# Patient Record
Sex: Male | Born: 1970 | Race: White | Hispanic: No | Marital: Single | Smoking: Never smoker
Health system: Southern US, Community
[De-identification: ages and names within clinical notes are randomized; demographics above are authoritative.]

---

## 2005-11-12 ENCOUNTER — Emergency Department (HOSPITAL_COMMUNITY): Admission: EM | Admit: 2005-11-12 | Discharge: 2005-11-12 | Payer: Self-pay | Admitting: Emergency Medicine

## 2009-02-25 ENCOUNTER — Inpatient Hospital Stay (HOSPITAL_COMMUNITY): Admission: EM | Admit: 2009-02-25 | Discharge: 2009-03-01 | Payer: Self-pay | Admitting: Emergency Medicine

## 2009-03-07 ENCOUNTER — Emergency Department (HOSPITAL_COMMUNITY): Admission: EM | Admit: 2009-03-07 | Discharge: 2009-03-07 | Payer: Self-pay | Admitting: Emergency Medicine

## 2009-03-20 ENCOUNTER — Encounter: Admission: RE | Admit: 2009-03-20 | Discharge: 2009-05-25 | Payer: Self-pay | Admitting: Orthopedic Surgery

## 2009-03-22 ENCOUNTER — Emergency Department (HOSPITAL_COMMUNITY): Admission: EM | Admit: 2009-03-22 | Discharge: 2009-03-22 | Payer: Self-pay | Admitting: Emergency Medicine

## 2009-03-26 HISTORY — PX: OTHER SURGICAL HISTORY: SHX169

## 2009-03-27 ENCOUNTER — Ambulatory Visit: Payer: Self-pay | Admitting: Internal Medicine

## 2009-03-27 ENCOUNTER — Encounter: Payer: Self-pay | Admitting: Physician Assistant

## 2009-03-27 ENCOUNTER — Telehealth: Payer: Self-pay | Admitting: Physician Assistant

## 2009-03-27 DIAGNOSIS — J45909 Unspecified asthma, uncomplicated: Secondary | ICD-10-CM | POA: Insufficient documentation

## 2009-03-27 DIAGNOSIS — IMO0002 Reserved for concepts with insufficient information to code with codable children: Secondary | ICD-10-CM | POA: Insufficient documentation

## 2009-03-27 DIAGNOSIS — R930 Abnormal findings on diagnostic imaging of skull and head, not elsewhere classified: Secondary | ICD-10-CM | POA: Insufficient documentation

## 2009-03-27 DIAGNOSIS — S7290XA Unspecified fracture of unspecified femur, initial encounter for closed fracture: Secondary | ICD-10-CM | POA: Insufficient documentation

## 2009-03-29 ENCOUNTER — Encounter: Payer: Self-pay | Admitting: Physician Assistant

## 2009-03-29 LAB — CONVERTED CEMR LAB
ALT: 41 units/L (ref 0–53)
BUN: 16 mg/dL (ref 6–23)
Calcium: 9.5 mg/dL (ref 8.4–10.5)
Chloride: 102 meq/L (ref 96–112)
Glucose, Bld: 105 mg/dL — ABNORMAL HIGH (ref 70–99)
HCT: 44.3 % (ref 39.0–52.0)
Hemoglobin: 14.2 g/dL (ref 13.0–17.0)
Lymphs Abs: 2 10*3/uL (ref 0.7–4.0)
MCV: 89.5 fL (ref 78.0–100.0)
Monocytes Absolute: 0.8 10*3/uL (ref 0.1–1.0)
Monocytes Relative: 9 % (ref 3–12)
Platelets: 396 10*3/uL (ref 150–400)
RBC: 4.95 M/uL (ref 4.22–5.81)
Sodium: 139 meq/L (ref 135–145)
Total Bilirubin: 1.1 mg/dL (ref 0.3–1.2)
WBC: 9.3 10*3/uL (ref 4.0–10.5)

## 2009-04-13 ENCOUNTER — Ambulatory Visit: Payer: Self-pay | Admitting: Physician Assistant

## 2009-04-23 LAB — CONVERTED CEMR LAB
Total CHOL/HDL Ratio: 5.2
Triglycerides: 294 mg/dL — ABNORMAL HIGH (ref <150)

## 2009-11-12 ENCOUNTER — Telehealth: Payer: Self-pay | Admitting: Physician Assistant

## 2010-01-25 ENCOUNTER — Encounter: Payer: Self-pay | Admitting: Physician Assistant

## 2010-04-23 ENCOUNTER — Telehealth: Payer: Self-pay | Admitting: Physician Assistant

## 2010-05-09 ENCOUNTER — Encounter (INDEPENDENT_AMBULATORY_CARE_PROVIDER_SITE_OTHER): Payer: Self-pay | Admitting: Nurse Practitioner

## 2010-05-09 ENCOUNTER — Ambulatory Visit: Payer: Self-pay | Admitting: Nurse Practitioner

## 2010-05-09 LAB — CONVERTED CEMR LAB: Rapid HIV Screen: NEGATIVE

## 2010-05-13 LAB — CONVERTED CEMR LAB
ALT: 38 units/L (ref 0–53)
AST: 33 units/L (ref 0–37)
Albumin: 5.1 g/dL (ref 3.5–5.2)
Basophils Absolute: 0.1 10*3/uL (ref 0.0–0.1)
CO2: 29 meq/L (ref 19–32)
Calcium: 10.2 mg/dL (ref 8.4–10.5)
Chloride: 100 meq/L (ref 96–112)
Cholesterol: 177 mg/dL (ref 0–200)
Eosinophils Absolute: 0.9 10*3/uL — ABNORMAL HIGH (ref 0.0–0.7)
Glucose, Bld: 98 mg/dL (ref 70–99)
HCT: 51.2 % (ref 39.0–52.0)
HDL: 57 mg/dL (ref >39)
Hemoglobin: 17.1 g/dL — ABNORMAL HIGH (ref 13.0–17.0)
Lymphocytes Relative: 33 % (ref 12–46)
Lymphs Abs: 3.7 10*3/uL (ref 0.7–4.0)
MCHC: 33.4 g/dL (ref 30.0–36.0)
MCV: 89.7 fL (ref 78.0–100.0)
Monocytes Absolute: 1 10*3/uL (ref 0.1–1.0)
Monocytes Relative: 9 % (ref 3–12)
Neutrophils Relative %: 50 % (ref 43–77)
RDW: 14 % (ref 11.5–15.5)
Total Bilirubin: 1 mg/dL (ref 0.3–1.2)
Total CHOL/HDL Ratio: 3.1
WBC: 11.3 10*3/uL — ABNORMAL HIGH (ref 4.0–10.5)

## 2010-06-25 NOTE — Progress Notes (Signed)
Summary: can't get his inhaler  Phone Note Call from Patient Call back at Home Phone [phone]   Reason for Call: Refill Medication Summary of Call: Hinata Diener pt. mr Dominique needs another refill on his albuterol inhaler and [facility] but was told that they no lonler have it and he would have to call the company for more, but he doesn't understand that. Initial call taken by: Leodis Rains,  April 23, 2010 9:33 AM  Follow-up for Phone Call        Spoke with Crystal at MAP with [facility] --  Inhaler was discontinued from regular formulary.  Pt. is not enrolled in MAP, needs financials and appt. to be enrolled.  Pt. advised of this and states will go to [facility] to get enrolled; says he still has enough albuterol to last until his next appointment here on 05/09/10. Follow-up by: Dutch Quint RN,  April 24, 2010 11:39 AM

## 2010-06-25 NOTE — Letter (Signed)
Summary: MAILED REQUESTED RECORDS TO [facility]  MAILED REQUESTED RECORDS TO [facility]   Imported By: Arta Bruce 01/25/2010 11:49:23  _____________________________________________________________________  External Attachment:    Type:   Image     Comment:   External Document
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
## 2010-06-27 NOTE — Assessment & Plan Note (Signed)
Summary: Asthma   Vital Signs:  Patient profile:   40 year old male Height:      66.50 inches Weight:      145.6 pounds BMI:     23.23 O2 Sat:      90 % on Room air Temp:     97.0 degrees F oral Pulse rate:   100 / minute Pulse rhythm:   regular Resp:     20 per minute BP sitting:   102 / 80  (left arm) Cuff size:   large  Vitals Entered By: Levon Hedger (May 09, 2010 12:11 PM)  O2 Flow:  Room air  Serial Vital Signs/Assessments:  Comments: P/F  270,  220,  230 By: Levon Hedger   CC: follow-up visit...needs breathing medication refilled Is Patient Diabetic? No Pain Assessment Patient in pain? no       Does patient need assistance? Functional Status Self care Ambulation Normal   CC:  follow-up visit...needs breathing medication refilled.  History of Present Illness:  Pt into the office for for f/u on asthma. "The past 2 days have been bad days"  Chest CT done 02/2009 shows a pulmonary nodule and was recommended to have the CT repeated in 12 months.  Pt was notified at that time of the results.   Pt is fasting today for labs  Asthma History    Initial Asthma Severity Rating:    Age range: 12+ years    Symptoms: 0-2 days/week    Nighttime Awakenings: 0-2/month    Interferes w/ normal activity: no limitations    SABA use (not for EIB): 0-2 days/week    Exacerbations requiring oral systemic steroids: 0-1/year    Asthma Severity Assessment: Intermittent    Habits & Providers  Alcohol-Tobacco-Diet     Alcohol drinks/day: <1     Alcohol Counseling: not indicated; use of alcohol is not excessive or problematic     Tobacco Status: never  Exercise-Depression-Behavior     Drug Use: marijuanna - socially  Allergies (verified): No Known Drug Allergies  Social History: Drug Use:  marijuanna - socially  Review of Systems General:  Denies fever. CV:  Denies chest pain or discomfort. Resp:  Denies shortness of breath. GI:  Denies abdominal  pain, nausea, and vomiting.  Physical Exam  General:  alert.  long hair Head:  normocephalic.   Mouth:  pharynx pink and moist.   Lungs:  scattered wheezes Heart:  normal rate and regular rhythm.   Neurologic:  cane use   Impression & Recommendations:  Problem # 1:  ASTHMA (ICD-493.90) advair diskus given to pt will refill inhaler asthma action plan given to pt His updated medication list for this problem includes:    Proventil Hfa 108 (90 Base) Mcg/act Aers (Albuterol sulfate) .Marland Kitchen... 1-2 puffs q 4-6 hours as needed for shortness of breath    Advair Diskus 250-50 Mcg/dose Aepb (Fluticasone-salmeterol) ..... One inhalation two times a day  Orders: Peak Flow Rate (94150) Pulse Oximetry (single measurment) (16109) T-Lipid Profile (60454-09811) T-Comprehensive Metabolic Panel (91478-29562) T-CBC w/Diff (13086-57846) Rapid HIV  (96295) T-TSH (28413-24401)  Problem # 2:  CT, CHEST, ABNORMAL (ICD-793.1) pt has declined to schedule at this time.  recommendation given to pt  Complete Medication List: 1)  Proventil Hfa 108 (90 Base) Mcg/act Aers (Albuterol sulfate) .Marland Kitchen.. 1-2 puffs q 4-6 hours as needed for shortness of breath 2)  Advair Diskus 250-50 Mcg/dose Aepb (Fluticasone-salmeterol) .... One inhalation two times a day  Other Orders: Flu Vaccine 66yrs + (  91478) Admin 1st Vaccine (29562)  Asthma Management Plan    Asthma Severity: Intermittent    Personal best PEF: 270 liters/minute    Predicted PEF: 626 liters/minute    Working PEF: 626 liters/minute    Plan based on PEF formula: [place] [employer] Zone: (Range: 500 to 630) ADVAIR DISKUS 250-50 MCG/DOSE AEPB:  1 inhalation twice a day  Yellow Zone: PROVENTIL HFA 108 (90 BASE) MCG/ACT AERS:  2 puffs every 4 hours as needed  Red Zone: Call your physician for shortness of breath.    Patient Instructions: 1)  You have been given the flu vaccine today 2)  Asthma - you have some wheezes today - likely from past 2 days  of asthma.  use advair - 1 inhalation two times a day (rinse mouth after use) 3)  Use inhaler as needed for shortness 4)  The recommendation is for your to get a repeat CT in 12 months which would be now.  You have declined at this time.  If you change your mind then inform this office Prescriptions: ADVAIR DISKUS 250-50 MCG/DOSE AEPB (FLUTICASONE-SALMETEROL) One inhalation two times a day  #1 x 0   Entered and Authorized by:   Lehman Prom FNP   Signed by:   Lehman Prom FNP on 05/09/2010   Method used:   Print then Give to Patient   RxID:   [id number] PROVENTIL HFA 108 (90 BASE) MCG/ACT AERS (ALBUTEROL SULFATE) 1-2 puffs q 4-6 hours as needed for shortness of breath  #1 x 3   Entered and Authorized by:   Lehman Prom FNP   Signed by:   Lehman Prom FNP on 05/09/2010   Method used:   Print then Give to Patient   RxID:   [id number]    Orders Added: 1)  Peak Flow Rate [94150] 2)  Pulse Oximetry (single measurment) [94760] 3)  Est. Patient Level III [64403] 4)  T-Lipid Profile [80061-22930] 5)  T-Comprehensive Metabolic Panel [80053-22900] 6)  T-CBC w/Diff [47425-95638] 7)  Rapid HIV  [92370] 8)  T-TSH [75643-32951] 9)  Flu Vaccine 75yrs + [88416] 10)  Admin 1st Vaccine [60630]   Immunizations Administered:  Influenza Vaccine # 1:    Vaccine Type: Fluvax 3+    Site: left deltoid    Mfr: GlaxoSmithKline    Dose: 0.5 ml    Route: IM    Given by: Hale Drone CMA    Exp. Date: 11/23/2010    Lot #: [id number]    VIS given: 12/18/09 version given May 09, 2010.  Flu Vaccine Consent Questions:    Do you have a history of severe allergic reactions to this vaccine? no    Any prior history of allergic reactions to egg and/or gelatin? no    Do you have a sensitivity to the preservative Thimersol? no    Do you have a past history of Guillan-Barre Syndrome? no    Do you currently have an acute febrile illness? no    Have you ever had a severe reaction to  latex? no    Vaccine information given and explained to patient? yes   Immunizations Administered:  Influenza Vaccine # 1:    Vaccine Type: Fluvax 3+    Site: left deltoid    Mfr: GlaxoSmithKline    Dose: 0.5 ml    Route: IM    Given by: Hale Drone CMA    Exp. Date: 11/23/2010    Lot #: [id number]    VIS given: 12/18/09 version  given May 09, 2010.  Prevention & Chronic Care Immunizations   Influenza vaccine: Fluvax 3+  (05/09/2010)    Tetanus booster: Not documented    Pneumococcal vaccine: Not documented  Other Screening   Smoking status: never  (05/09/2010)  Lipids   Total Cholesterol: 214  (04/13/2009)   LDL: 114  (04/13/2009)   LDL Direct: Not documented   HDL: 41  (04/13/2009)   Triglycerides: 294  (04/13/2009)   Nursing Instructions: Give Flu vaccine today     Laboratory Results  Date/Time Received: May 09, 2010 2:04 PM   Other Tests  Rapid HIV: negative

## 2010-07-30 ENCOUNTER — Encounter (INDEPENDENT_AMBULATORY_CARE_PROVIDER_SITE_OTHER): Payer: Self-pay | Admitting: Nurse Practitioner

## 2010-08-06 NOTE — Letter (Signed)
Summary: MAILED REQUESTED RECORDS TO [facility]   MAILED REQUESTED RECORDS TO [facility]   Imported By: Arta Bruce 07/30/2010 14:23:36  _____________________________________________________________________  External Attachment:    Type:   Image     Comment:   External Document

## 2010-08-29 LAB — CBC
HCT: 31.2 % — ABNORMAL LOW (ref 39.0–52.0)
HCT: 31.2 % — ABNORMAL LOW (ref 39.0–52.0)
Hemoglobin: 10.7 g/dL — ABNORMAL LOW (ref 13.0–17.0)
Hemoglobin: 10.8 g/dL — ABNORMAL LOW (ref 13.0–17.0)
Hemoglobin: 11.4 g/dL — ABNORMAL LOW (ref 13.0–17.0)
MCHC: 33.4 g/dL (ref 30.0–36.0)
MCHC: 34.3 g/dL (ref 30.0–36.0)
Platelets: 232 10*3/uL (ref 150–400)
Platelets: 266 10*3/uL (ref 150–400)
RBC: 3.52 MIL/uL — ABNORMAL LOW (ref 4.22–5.81)
RBC: 3.77 MIL/uL — ABNORMAL LOW (ref 4.22–5.81)
RDW: 14 % (ref 11.5–15.5)
RDW: 14.6 % (ref 11.5–15.5)
RDW: 15.8 % — ABNORMAL HIGH (ref 11.5–15.5)
WBC: 10.1 10*3/uL (ref 4.0–10.5)
WBC: 21.8 10*3/uL — ABNORMAL HIGH (ref 4.0–10.5)

## 2010-08-29 LAB — TYPE AND SCREEN

## 2010-08-29 LAB — POCT I-STAT 7, (LYTES, BLD GAS, ICA,H+H)
Acid-Base Excess: 1 mmol/L (ref 0.0–2.0)
Acid-Base Excess: 3 mmol/L — ABNORMAL HIGH (ref 0.0–2.0)
Bicarbonate: 25 mEq/L — ABNORMAL HIGH (ref 20.0–24.0)
Bicarbonate: 26.3 mEq/L — ABNORMAL HIGH (ref 20.0–24.0)
Bicarbonate: 26.3 mEq/L — ABNORMAL HIGH (ref 20.0–24.0)
Bicarbonate: 27.3 mEq/L — ABNORMAL HIGH (ref 20.0–24.0)
Calcium, Ion: 1.01 mmol/L — ABNORMAL LOW (ref 1.12–1.32)
HCT: 21 % — ABNORMAL LOW (ref 39.0–52.0)
HCT: 29 % — ABNORMAL LOW (ref 39.0–52.0)
HCT: 30 % — ABNORMAL LOW (ref 39.0–52.0)
Hemoglobin: 10.2 g/dL — ABNORMAL LOW (ref 13.0–17.0)
Hemoglobin: 7.1 g/dL — CL (ref 13.0–17.0)
O2 Saturation: 100 %
O2 Saturation: 100 %
Patient temperature: 36.1
Patient temperature: 36.7
Sodium: 135 mEq/L (ref 135–145)
Sodium: 135 mEq/L (ref 135–145)
TCO2: 26 mmol/L (ref 0–100)
TCO2: 28 mmol/L (ref 0–100)
pCO2 arterial: 38 mmHg (ref 35.0–45.0)
pH, Arterial: 7.408 (ref 7.350–7.450)
pH, Arterial: 7.445 (ref 7.350–7.450)
pO2, Arterial: 469 mmHg — ABNORMAL HIGH (ref 80.0–100.0)
pO2, Arterial: 508 mmHg — ABNORMAL HIGH (ref 80.0–100.0)

## 2010-08-29 LAB — BASIC METABOLIC PANEL
CO2: 30 mEq/L (ref 19–32)
Calcium: 7 mg/dL — ABNORMAL LOW (ref 8.4–10.5)
Calcium: 7.4 mg/dL — ABNORMAL LOW (ref 8.4–10.5)
Creatinine, Ser: 0.71 mg/dL (ref 0.4–1.5)
GFR calc Af Amer: >60 mL/min (ref >60)
GFR calc Af Amer: >60 mL/min (ref >60)
GFR calc non Af Amer: >60 mL/min (ref >60)
GFR calc non Af Amer: >60 mL/min (ref >60)
GFR calc non Af Amer: >60 mL/min (ref >60)
Glucose, Bld: 129 mg/dL — ABNORMAL HIGH (ref 70–99)
Potassium: 3.1 mEq/L — ABNORMAL LOW (ref 3.5–5.1)
Potassium: 4.1 mEq/L (ref 3.5–5.1)
Sodium: 131 mEq/L — ABNORMAL LOW (ref 135–145)
Sodium: 132 mEq/L — ABNORMAL LOW (ref 135–145)
Sodium: 133 mEq/L — ABNORMAL LOW (ref 135–145)

## 2010-08-29 LAB — COMPREHENSIVE METABOLIC PANEL
AST: 117 U/L — ABNORMAL HIGH (ref 0–37)
Albumin: 3.1 g/dL — ABNORMAL LOW (ref 3.5–5.2)
Alkaline Phosphatase: 82 U/L (ref 39–117)
BUN: 8 mg/dL (ref 6–23)
CO2: 28 mEq/L (ref 19–32)
Chloride: 103 mEq/L (ref 96–112)
GFR calc Af Amer: >60 mL/min (ref >60)
GFR calc non Af Amer: >60 mL/min (ref >60)
Glucose, Bld: 182 mg/dL — ABNORMAL HIGH (ref 70–99)
Potassium: 3 mEq/L — ABNORMAL LOW (ref 3.5–5.1)
Total Bilirubin: 0.6 mg/dL (ref 0.3–1.2)

## 2010-08-29 LAB — URINALYSIS, ROUTINE W REFLEX MICROSCOPIC
Bilirubin Urine: NEGATIVE
Specific Gravity, Urine: 1.024 (ref 1.005–1.030)
Urobilinogen, UA: 0.2 mg/dL (ref 0.0–1.0)

## 2010-08-29 LAB — RAPID URINE DRUG SCREEN, HOSP PERFORMED
Benzodiazepines: POSITIVE — AB
Cocaine: NOT DETECTED
Tetrahydrocannabinol: NOT DETECTED

## 2010-08-29 LAB — DIFFERENTIAL
Basophils Relative: 0 % (ref 0–1)
Eosinophils Absolute: 1.1 10*3/uL — ABNORMAL HIGH (ref 0.0–0.7)
Eosinophils Relative: 1 % (ref 0–5)
Lymphocytes Relative: 9 % — ABNORMAL LOW (ref 12–46)
Monocytes Absolute: 0.7 10*3/uL (ref 0.1–1.0)
Monocytes Relative: 5 % (ref 3–12)
Monocytes Relative: 6 % (ref 3–12)
Neutro Abs: 8.9 10*3/uL — ABNORMAL HIGH (ref 1.7–7.7)
Neutrophils Relative %: 67 % (ref 43–77)

## 2010-08-29 LAB — ETHANOL: Alcohol, Ethyl (B): <5 mg/dL (ref 0–10)

## 2010-08-29 LAB — URINE MICROSCOPIC-ADD ON

## 2010-08-29 LAB — URINE CULTURE

## 2010-08-29 LAB — ABO/RH: ABO/RH(D): A POS

## 2010-08-29 LAB — MRSA PCR SCREENING

## 2010-08-29 LAB — PROTIME-INR: INR: 1 (ref 0.00–1.49)
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
## 2011-08-26 ENCOUNTER — Encounter (HOSPITAL_COMMUNITY): Payer: Self-pay | Admitting: Pharmacy Technician

## 2011-09-04 ENCOUNTER — Other Ambulatory Visit: Payer: Self-pay

## 2011-09-04 ENCOUNTER — Encounter (HOSPITAL_COMMUNITY)
Admission: RE | Admit: 2011-09-04 | Discharge: 2011-09-04 | Disposition: A | Discharge status: Home / Self Care | Payer: Medicaid Other | Source: Ambulatory Visit | Attending: Orthopedic Surgery | Admitting: Orthopedic Surgery

## 2011-09-04 ENCOUNTER — Encounter (HOSPITAL_COMMUNITY): Payer: Self-pay

## 2011-09-04 ENCOUNTER — Ambulatory Visit (HOSPITAL_COMMUNITY)
Admission: RE | Admit: 2011-09-04 | Discharge: 2011-09-04 | Disposition: A | Discharge status: Home / Self Care | Payer: Medicaid Other | Source: Ambulatory Visit | Attending: Orthopedic Surgery | Admitting: Orthopedic Surgery

## 2011-09-04 DIAGNOSIS — Z01818 Encounter for other preprocedural examination: Secondary | ICD-10-CM | POA: Insufficient documentation

## 2011-09-04 DIAGNOSIS — Z01812 Encounter for preprocedural laboratory examination: Secondary | ICD-10-CM | POA: Insufficient documentation

## 2011-09-04 LAB — CBC
HCT: 47.8 % (ref 39.0–52.0)
Platelets: 391 10*3/uL (ref 150–400)
RBC: 5.37 MIL/uL (ref 4.22–5.81)
RDW: 13.8 % (ref 11.5–15.5)
WBC: 9.4 10*3/uL (ref 4.0–10.5)

## 2011-09-04 LAB — URINALYSIS, ROUTINE W REFLEX MICROSCOPIC
Leukocytes, UA: NEGATIVE
Nitrite: NEGATIVE
Protein, ur: NEGATIVE mg/dL
Urobilinogen, UA: 0.2 mg/dL (ref 0.0–1.0)

## 2011-09-04 LAB — APTT: aPTT: 31 seconds (ref 24–37)

## 2011-09-04 LAB — DIFFERENTIAL
Basophils Absolute: 0 10*3/uL (ref 0.0–0.1)
Lymphocytes Relative: 28 % (ref 12–46)
Neutro Abs: 6 10*3/uL (ref 1.7–7.7)

## 2011-09-04 LAB — BASIC METABOLIC PANEL
Calcium: 10 mg/dL (ref 8.4–10.5)
Chloride: 101 mEq/L (ref 96–112)
Creatinine, Ser: 0.77 mg/dL (ref 0.50–1.35)
GFR calc Af Amer: >90 mL/min (ref >90)
Sodium: 140 mEq/L (ref 135–145)

## 2011-09-04 LAB — SURGICAL PCR SCREEN: Staphylococcus aureus: POSITIVE — AB

## 2011-09-04 LAB — PROTIME-INR: Prothrombin Time: 12.8 seconds (ref 11.6–15.2)

## 2011-09-04 NOTE — Patient Instructions (Addendum)
20 Monterrio Gerst  09/04/2011   Your procedure is scheduled on:  09-08-11   Report to [facility] at 1030  AM.  Call this number if you have problems the morning of surgery: [phone]   Remember:   Do not eat food or drink liquids:After Midnight.  .  Take these medicines the morning of surgery with A SIP OF WATER: advair, hydrocodone if needed   Do not wear jewelry or make up.  Do not wear lotions, powders, or perfumes.Do not wear deodorant.    Do not bring valuables to the hospital.  Contacts, dentures or bridgework may not be worn into surgery.  Leave suitcase in the car. After surgery it may be brought to your room.  For patients admitted to the hospital, checkout time is 11:00 AM the day of discharge.     Special Instructions: CHG Shower Use Special Wash: 1/2 bottle night before surgery and 1/2 bottle morning of surgery.neck down avoid private area   Please read over the following fact sheets that you were given: MRSA Information  [facility] pre op nurse phone number [phone], call if needed
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
## 2011-09-05 NOTE — Pre-Procedure Instructions (Signed)
Pt aware surgery time changed to 0730, arrive 0530 am 09-08-11

## 2011-09-08 ENCOUNTER — Ambulatory Visit (HOSPITAL_COMMUNITY): Payer: Medicaid Other

## 2011-09-08 ENCOUNTER — Encounter (HOSPITAL_COMMUNITY): Payer: Self-pay | Admitting: Anesthesiology

## 2011-09-08 ENCOUNTER — Encounter (HOSPITAL_COMMUNITY)
Admission: RE | Disposition: A | Discharge status: Home Health | Payer: Self-pay | Source: Ambulatory Visit | Attending: Orthopedic Surgery

## 2011-09-08 ENCOUNTER — Inpatient Hospital Stay (HOSPITAL_COMMUNITY)
Admission: RE | Admit: 2011-09-08 | Discharge: 2011-09-10 | DRG: 467 | Disposition: A | Discharge status: Home Health | Payer: Medicaid Other | Source: Ambulatory Visit | Attending: Orthopedic Surgery | Admitting: Orthopedic Surgery

## 2011-09-08 ENCOUNTER — Encounter (HOSPITAL_COMMUNITY): Payer: Self-pay | Admitting: *Deleted

## 2011-09-08 ENCOUNTER — Ambulatory Visit (HOSPITAL_COMMUNITY): Payer: Medicaid Other | Admitting: Anesthesiology

## 2011-09-08 DIAGNOSIS — T84498A Other mechanical complication of other internal orthopedic devices, implants and grafts, initial encounter: Principal | ICD-10-CM | POA: Diagnosis present

## 2011-09-08 DIAGNOSIS — Z96649 Presence of unspecified artificial hip joint: Secondary | ICD-10-CM

## 2011-09-08 DIAGNOSIS — M87059 Idiopathic aseptic necrosis of unspecified femur: Secondary | ICD-10-CM | POA: Diagnosis present

## 2011-09-08 DIAGNOSIS — Y831 Surgical operation with implant of artificial internal device as the cause of abnormal reaction of the patient, or of later complication, without mention of misadventure at the time of the procedure: Secondary | ICD-10-CM | POA: Diagnosis present

## 2011-09-08 DIAGNOSIS — J45909 Unspecified asthma, uncomplicated: Secondary | ICD-10-CM | POA: Diagnosis present

## 2011-09-08 HISTORY — PX: HARDWARE REMOVAL: SHX979

## 2011-09-08 LAB — TYPE AND SCREEN: ABO/RH(D): A POS

## 2011-09-08 LAB — ABO/RH: ABO/RH(D): A POS

## 2011-09-08 SURGERY — REMOVAL, HARDWARE
Anesthesia: General | Site: Hip | Laterality: Left | Wound class: Clean

## 2011-09-08 MED ORDER — DIPHENHYDRAMINE HCL 25 MG PO CAPS: 25.0000 mg | ORAL_CAPSULE | Freq: Four times a day (QID) | ORAL | Status: DC | PRN

## 2011-09-08 MED ORDER — HETASTARCH-ELECTROLYTES 6 % IV SOLN
INTRAVENOUS | Status: DC | PRN
  Administered 2011-09-08: 09:00:00 via INTRAVENOUS

## 2011-09-08 MED ORDER — HYDROMORPHONE HCL PF 1 MG/ML IJ SOLN
0.2500 mg | INTRAMUSCULAR | Status: DC | PRN
  Administered 2011-09-08 (×4): 0.5 mg via INTRAVENOUS

## 2011-09-08 MED ORDER — FLEET ENEMA 7-19 GM/118ML RE ENEM: 1.0000 | ENEMA | Freq: Once | RECTAL | Status: AC | PRN

## 2011-09-08 MED ORDER — HYDROMORPHONE HCL PF 1 MG/ML IJ SOLN
INTRAMUSCULAR | Status: AC
  Filled 2011-09-08: qty 1

## 2011-09-08 MED ORDER — SODIUM CHLORIDE 0.9 % IV SOLN
100.0000 mL/h | INTRAVENOUS | Status: DC
  Administered 2011-09-08 (×2): 100 mL/h via INTRAVENOUS
  Filled 2011-09-08 (×8): qty 1000

## 2011-09-08 MED ORDER — ALUM & MAG HYDROXIDE-SIMETH 200-200-20 MG/5ML PO SUSP: 30.0000 mL | ORAL | Status: DC | PRN

## 2011-09-08 MED ORDER — ROCURONIUM BROMIDE 100 MG/10ML IV SOLN
INTRAVENOUS | Status: DC | PRN
  Administered 2011-09-08: 20 mg via INTRAVENOUS

## 2011-09-08 MED ORDER — POLYETHYLENE GLYCOL 3350 17 G PO PACK
17.0000 g | PACK | Freq: Two times a day (BID) | ORAL | Status: DC
  Administered 2011-09-08 – 2011-09-09 (×2): 17 g via ORAL
  Filled 2011-09-08 (×5): qty 1

## 2011-09-08 MED ORDER — BISACODYL 5 MG PO TBEC: 5.0000 mg | DELAYED_RELEASE_TABLET | Freq: Every day | ORAL | Status: DC | PRN

## 2011-09-08 MED ORDER — CIPROFLOXACIN IN D5W 400 MG/200ML IV SOLN
INTRAVENOUS | Status: AC
  Filled 2011-09-08: qty 200

## 2011-09-08 MED ORDER — METOCLOPRAMIDE HCL 5 MG/ML IJ SOLN: 5.0000 mg | Freq: Three times a day (TID) | INTRAMUSCULAR | Status: DC | PRN

## 2011-09-08 MED ORDER — DEXAMETHASONE SODIUM PHOSPHATE 10 MG/ML IJ SOLN
INTRAMUSCULAR | Status: DC | PRN
  Administered 2011-09-08: 10 mg via INTRAVENOUS

## 2011-09-08 MED ORDER — DIAZEPAM 5 MG PO TABS: 10.0000 mg | ORAL_TABLET | Freq: Three times a day (TID) | ORAL | Status: DC | PRN

## 2011-09-08 MED ORDER — ONDANSETRON HCL 4 MG/2ML IJ SOLN
INTRAMUSCULAR | Status: DC | PRN
  Administered 2011-09-08: 4 mg via INTRAVENOUS

## 2011-09-08 MED ORDER — PROPOFOL 10 MG/ML IV BOLUS
INTRAVENOUS | Status: DC | PRN
  Administered 2011-09-08: 150 mg via INTRAVENOUS

## 2011-09-08 MED ORDER — ONDANSETRON HCL 4 MG/2ML IJ SOLN: 4.0000 mg | Freq: Four times a day (QID) | INTRAMUSCULAR | Status: DC | PRN

## 2011-09-08 MED ORDER — LACTATED RINGERS IV SOLN
INTRAVENOUS | Status: DC | PRN
  Administered 2011-09-08 (×3): via INTRAVENOUS

## 2011-09-08 MED ORDER — ACETAMINOPHEN 10 MG/ML IV SOLN
INTRAVENOUS | Status: AC
  Filled 2011-09-08: qty 100

## 2011-09-08 MED ORDER — TRANEXAMIC ACID 100 MG/ML IV SOLN
980.0000 mg | Freq: Once | INTRAVENOUS | Status: AC
  Administered 2011-09-08: 980 mg via INTRAVENOUS
  Filled 2011-09-08: qty 9.8

## 2011-09-08 MED ORDER — HYDROMORPHONE HCL PF 1 MG/ML IJ SOLN: 0.5000 mg | INTRAMUSCULAR | Status: DC | PRN

## 2011-09-08 MED ORDER — SUCCINYLCHOLINE CHLORIDE 20 MG/ML IJ SOLN
INTRAMUSCULAR | Status: DC | PRN
  Administered 2011-09-08: 100 mg via INTRAVENOUS

## 2011-09-08 MED ORDER — RIVAROXABAN 10 MG PO TABS
10.0000 mg | ORAL_TABLET | ORAL | Status: DC
  Administered 2011-09-09 – 2011-09-10 (×2): 10 mg via ORAL
  Filled 2011-09-08 (×2): qty 1

## 2011-09-08 MED ORDER — KETOROLAC TROMETHAMINE 15 MG/ML IJ SOLN
15.0000 mg | Freq: Four times a day (QID) | INTRAMUSCULAR | Status: DC
  Administered 2011-09-08 – 2011-09-09 (×5): 15 mg via INTRAVENOUS
  Filled 2011-09-08 (×10): qty 1

## 2011-09-08 MED ORDER — CEFAZOLIN SODIUM 1-5 GM-% IV SOLN
1.0000 g | INTRAVENOUS | Status: AC
  Administered 2011-09-08: 1 g via INTRAVENOUS

## 2011-09-08 MED ORDER — DOCUSATE SODIUM 100 MG PO CAPS
100.0000 mg | ORAL_CAPSULE | Freq: Two times a day (BID) | ORAL | Status: DC
  Administered 2011-09-08 – 2011-09-09 (×3): 100 mg via ORAL
  Filled 2011-09-08 (×5): qty 1

## 2011-09-08 MED ORDER — FERROUS SULFATE 325 (65 FE) MG PO TABS
325.0000 mg | ORAL_TABLET | Freq: Three times a day (TID) | ORAL | Status: DC
  Administered 2011-09-08 – 2011-09-09 (×3): 325 mg via ORAL
  Filled 2011-09-08 (×7): qty 1

## 2011-09-08 MED ORDER — ZOLPIDEM TARTRATE 5 MG PO TABS: 5.0000 mg | ORAL_TABLET | Freq: Every evening | ORAL | Status: DC | PRN

## 2011-09-08 MED ORDER — PROMETHAZINE HCL 25 MG/ML IJ SOLN: 6.2500 mg | INTRAMUSCULAR | Status: DC | PRN

## 2011-09-08 MED ORDER — FENTANYL CITRATE 0.05 MG/ML IJ SOLN
INTRAMUSCULAR | Status: DC | PRN
  Administered 2011-09-08 (×6): 50 ug via INTRAVENOUS
  Administered 2011-09-08: 100 ug via INTRAVENOUS
  Administered 2011-09-08 (×2): 50 ug via INTRAVENOUS

## 2011-09-08 MED ORDER — ACETAMINOPHEN 10 MG/ML IV SOLN
INTRAVENOUS | Status: DC | PRN
  Administered 2011-09-08: 1000 mg via INTRAVENOUS

## 2011-09-08 MED ORDER — MEPERIDINE HCL 50 MG/ML IJ SOLN: 6.2500 mg | INTRAMUSCULAR | Status: DC | PRN

## 2011-09-08 MED ORDER — METOCLOPRAMIDE HCL 10 MG PO TABS: 5.0000 mg | ORAL_TABLET | Freq: Three times a day (TID) | ORAL | Status: DC | PRN

## 2011-09-08 MED ORDER — HYDROCODONE-ACETAMINOPHEN 7.5-325 MG PO TABS
1.0000 | ORAL_TABLET | ORAL | Status: DC
  Administered 2011-09-08 – 2011-09-10 (×11): 2 via ORAL
  Filled 2011-09-08 (×11): qty 2

## 2011-09-08 MED ORDER — MENTHOL 3 MG MT LOZG: 1.0000 | LOZENGE | OROMUCOSAL | Status: DC | PRN

## 2011-09-08 MED ORDER — DEXAMETHASONE SODIUM PHOSPHATE 10 MG/ML IJ SOLN
10.0000 mg | Freq: Once | INTRAMUSCULAR | Status: AC
  Administered 2011-09-09: 10 mg via INTRAVENOUS
  Filled 2011-09-08: qty 1

## 2011-09-08 MED ORDER — METHOCARBAMOL 100 MG/ML IJ SOLN
500.0000 mg | Freq: Four times a day (QID) | INTRAVENOUS | Status: DC | PRN
  Administered 2011-09-08 (×2): 500 mg via INTRAVENOUS
  Filled 2011-09-08 (×2): qty 5

## 2011-09-08 MED ORDER — CIPROFLOXACIN IN D5W 400 MG/200ML IV SOLN
INTRAVENOUS | Status: DC | PRN
  Administered 2011-09-08: 400 mg via INTRAVENOUS

## 2011-09-08 MED ORDER — MIDAZOLAM HCL 5 MG/5ML IJ SOLN
INTRAMUSCULAR | Status: DC | PRN
  Administered 2011-09-08: 2 mg via INTRAVENOUS

## 2011-09-08 MED ORDER — METHOCARBAMOL 500 MG PO TABS
500.0000 mg | ORAL_TABLET | Freq: Four times a day (QID) | ORAL | Status: DC | PRN
  Administered 2011-09-09: 500 mg via ORAL
  Filled 2011-09-08: qty 1

## 2011-09-08 MED ORDER — LACTATED RINGERS IV SOLN
INTRAVENOUS | Status: DC
  Administered 2011-09-08: 12:00:00 via INTRAVENOUS

## 2011-09-08 MED ORDER — LACTATED RINGERS IV SOLN: INTRAVENOUS | Status: DC

## 2011-09-08 MED ORDER — CEFAZOLIN SODIUM 1-5 GM-% IV SOLN
INTRAVENOUS | Status: AC
  Filled 2011-09-08: qty 50

## 2011-09-08 MED ORDER — FLUTICASONE-SALMETEROL 250-50 MCG/DOSE IN AEPB
1.0000 | INHALATION_SPRAY | Freq: Two times a day (BID) | RESPIRATORY_TRACT | Status: DC
  Administered 2011-09-08 – 2011-09-10 (×4): 1 via RESPIRATORY_TRACT
  Filled 2011-09-08: qty 14

## 2011-09-08 MED ORDER — ONDANSETRON HCL 4 MG PO TABS: 4.0000 mg | ORAL_TABLET | Freq: Four times a day (QID) | ORAL | Status: DC | PRN

## 2011-09-08 MED ORDER — CEFAZOLIN SODIUM 1-5 GM-% IV SOLN
1.0000 g | Freq: Four times a day (QID) | INTRAVENOUS | Status: AC
  Administered 2011-09-08 – 2011-09-09 (×3): 1 g via INTRAVENOUS
  Filled 2011-09-08 (×3): qty 50

## 2011-09-08 MED ORDER — PHENOL 1.4 % MT LIQD: 1.0000 | OROMUCOSAL | Status: DC | PRN

## 2011-09-08 SURGICAL SUPPLY — 66 items
BAG ZIPLOCK 12X15 (MISCELLANEOUS) ×2 IMPLANT
BLADE SAW SGTL 18X1.27X75 (BLADE) ×2 IMPLANT
BRUSH FEMORAL CANAL (MISCELLANEOUS) IMPLANT
CLOTH BEACON ORANGE TIMEOUT ST (SAFETY) ×2 IMPLANT
CONT SPECI 4OZ STER CLIK (MISCELLANEOUS) ×2 IMPLANT
DERMABOND ADVANCED (GAUZE/BANDAGES/DRESSINGS) ×1
DERMABOND ADVANCED .7 DNX12 (GAUZE/BANDAGES/DRESSINGS) ×1 IMPLANT
DRAPE INCISE IOBAN 85X60 (DRAPES) ×2 IMPLANT
DRAPE ORTHO SPLIT 77X108 STRL (DRAPES) ×2
DRAPE POUCH INSTRU U-SHP 10X18 (DRAPES) ×2 IMPLANT
DRAPE STERI IOBAN 125X83 (DRAPES) ×2 IMPLANT
DRAPE SURG 17X11 SM STRL (DRAPES) ×2 IMPLANT
DRAPE SURG ORHT 6 SPLT 77X108 (DRAPES) ×2 IMPLANT
DRAPE U-SHAPE 47X51 STRL (DRAPES) ×2 IMPLANT
DRSG AQUACEL AG ADV 3.5X 4 (GAUZE/BANDAGES/DRESSINGS) ×2 IMPLANT
DRSG AQUACEL AG ADV 3.5X10 (GAUZE/BANDAGES/DRESSINGS) ×2 IMPLANT
DRSG EMULSION OIL 3X16 NADH (GAUZE/BANDAGES/DRESSINGS) ×2 IMPLANT
DRSG MEPILEX BORDER 4X4 (GAUZE/BANDAGES/DRESSINGS) IMPLANT
DRSG MEPILEX BORDER 4X8 (GAUZE/BANDAGES/DRESSINGS) IMPLANT
DRSG PAD ABDOMINAL 8X10 ST (GAUZE/BANDAGES/DRESSINGS) ×2 IMPLANT
DURAPREP 26ML APPLICATOR (WOUND CARE) ×2 IMPLANT
ELECT BLADE TIP CTD 4 INCH (ELECTRODE) ×2 IMPLANT
ELECT REM PT RETURN 9FT ADLT (ELECTROSURGICAL) ×2
ELECTRODE REM PT RTRN 9FT ADLT (ELECTROSURGICAL) ×1 IMPLANT
EVACUATOR 1/8 PVC DRAIN (DRAIN) ×2 IMPLANT
FACESHIELD LNG OPTICON STERILE (SAFETY) ×8 IMPLANT
GAUZE SPONGE 2X2 8PLY STRL LF (GAUZE/BANDAGES/DRESSINGS) ×1 IMPLANT
GLOVE BIOGEL PI IND STRL 7.5 (GLOVE) ×2 IMPLANT
GLOVE BIOGEL PI IND STRL 8 (GLOVE) ×2 IMPLANT
GLOVE BIOGEL PI INDICATOR 7.5 (GLOVE) ×2
GLOVE BIOGEL PI INDICATOR 8 (GLOVE) ×2
GLOVE ECLIPSE 8.0 STRL XLNG CF (GLOVE) IMPLANT
GLOVE ORTHO TXT STRL SZ7.5 (GLOVE) ×4 IMPLANT
GLOVE SURG ORTHO 8.0 STRL STRW (GLOVE) ×2 IMPLANT
GOWN BRE IMP PREV XXLGXLNG (GOWN DISPOSABLE) ×4 IMPLANT
GOWN STRL NON-REIN LRG LVL3 (GOWN DISPOSABLE) ×2 IMPLANT
HANDPIECE INTERPULSE COAX TIP (DISPOSABLE)
KIT BASIN OR (CUSTOM PROCEDURE TRAY) ×2 IMPLANT
LINER NEUTRAL 52X36MM PLUS 4 (Liner) IMPLANT
MANIFOLD NEPTUNE II (INSTRUMENTS) ×2 IMPLANT
NS IRRIG 1000ML POUR BTL (IV SOLUTION) ×2 IMPLANT
PACK GENERAL/GYN (CUSTOM PROCEDURE TRAY) IMPLANT
PACK LOWER EXTREMITY WL (CUSTOM PROCEDURE TRAY) ×2 IMPLANT
PACK TOTAL JOINT (CUSTOM PROCEDURE TRAY) ×2 IMPLANT
PILLOW ABDUCTION HIP (SOFTGOODS) ×2 IMPLANT
POSITIONER SURGICAL ARM (MISCELLANEOUS) ×2 IMPLANT
PRESSURIZER FEMORAL UNIV (MISCELLANEOUS) IMPLANT
SET HNDPC FAN SPRY TIP SCT (DISPOSABLE) IMPLANT
SPONGE GAUZE 2X2 STER 10/PKG (GAUZE/BANDAGES/DRESSINGS) ×1
SPONGE GAUZE 4X4 12PLY (GAUZE/BANDAGES/DRESSINGS) ×2 IMPLANT
SPONGE LAP 18X18 X RAY DECT (DISPOSABLE) ×2 IMPLANT
SPONGE LAP 4X18 X RAY DECT (DISPOSABLE) ×2 IMPLANT
STAPLER VISISTAT 35W (STAPLE) ×2 IMPLANT
STRIP CLOSURE SKIN 1/2X4 (GAUZE/BANDAGES/DRESSINGS) IMPLANT
SUCTION FRAZIER TIP 10 FR DISP (SUCTIONS) ×2 IMPLANT
SUT MNCRL AB 4-0 PS2 18 (SUTURE) ×2 IMPLANT
SUT VIC AB 1 CT1 27 (SUTURE) ×4
SUT VIC AB 1 CT1 27XBRD ANTBC (SUTURE) ×4 IMPLANT
SUT VIC AB 1 CT1 36 (SUTURE) ×4 IMPLANT
SUT VIC AB 2-0 CT1 27 (SUTURE) ×2
SUT VIC AB 2-0 CT1 TAPERPNT 27 (SUTURE) ×2 IMPLANT
SUT VLOC 180 0 24IN GS25 (SUTURE) ×4 IMPLANT
TOWEL OR 17X26 10 PK STRL BLUE (TOWEL DISPOSABLE) ×4 IMPLANT
TOWER CARTRIDGE SMART MIX (DISPOSABLE) IMPLANT
TRAY FOLEY CATH 14FRSI W/METER (CATHETERS) ×2 IMPLANT
WATER STERILE IRR 1500ML POUR (IV SOLUTION) ×2 IMPLANT

## 2011-09-08 NOTE — Preoperative (Signed)
Beta Blockers   Reason not to administer Beta Blockers:Not Applicable 

## 2011-09-08 NOTE — Anesthesia Preprocedure Evaluation (Addendum)
Anesthesia Evaluation  Patient identified by MRN, date of birth, ID band Patient awake    Reviewed: Allergy & Precautions, H&P , NPO status , Patient's Chart, lab work & pertinent test results  Airway Mallampati: II TM Distance: >3 FB Neck ROM: full    Dental No notable dental hx.    Pulmonary neg pulmonary ROS, asthma ,  breath sounds clear to auscultation  Pulmonary exam normal       Cardiovascular Exercise Tolerance: Good negative cardio ROS  Rhythm:regular Rate:Normal     Neuro/Psych negative neurological ROS  negative psych ROS   GI/Hepatic negative GI ROS, Neg liver ROS,   Endo/Other  negative endocrine ROS  Renal/GU negative Renal ROS  negative genitourinary   Musculoskeletal negative musculoskeletal ROS (+)   Abdominal   Peds negative pediatric ROS (+)  Hematology negative hematology ROS (+)   Anesthesia Other Findings   Reproductive/Obstetrics negative OB ROS                          Anesthesia Physical Anesthesia Plan  ASA: II  Anesthesia Plan: General   Post-op Pain Management:    Induction:   Airway Management Planned: Oral ETT  Additional Equipment:   Intra-op Plan:   Post-operative Plan: Extubation in OR  Informed Consent: I have reviewed the patients History and Physical, chart, labs and discussed the procedure including the risks, benefits and alternatives for the proposed anesthesia with the patient or authorized representative who has indicated his/her understanding and acceptance.   Dental Advisory Given  Plan Discussed with: CRNA  Anesthesia Plan Comments:        Anesthesia Quick Evaluation

## 2011-09-08 NOTE — Anesthesia Postprocedure Evaluation (Signed)
  Anesthesia Post-op Note  Patient: Phillip Moore  Procedure(s) Performed: Procedure(s) (LRB): HARDWARE REMOVAL (Left) CONVERSION TO TOTAL HIP (Left)  Patient Location: PACU  Anesthesia Type: General  Level of Consciousness: awake and alert   Airway and Oxygen Therapy: Patient Spontanous Breathing  Post-op Pain: mild  Post-op Assessment: Post-op Vital signs reviewed, Patient's Cardiovascular Status Stable, Respiratory Function Stable, Patent Airway and No signs of Nausea or vomiting  Post-op Vital Signs: stable  Complications: No apparent anesthesia complications

## 2011-09-08 NOTE — Transfer of Care (Signed)
Immediate Anesthesia Transfer of Care Note  Patient: Phillip Moore  Procedure(s) Performed: Procedure(s) (LRB): HARDWARE REMOVAL (Left) CONVERSION TO TOTAL HIP (Left)  Patient Location: PACU  Anesthesia Type: General  Level of Consciousness: awake, alert , oriented and patient cooperative  Airway & Oxygen Therapy: Patient Spontanous Breathing and Patient connected to face mask oxygen  Post-op Assessment: Report given to PACU RN, Post -op Vital signs reviewed and stable and Patient moving all extremities  Post vital signs: Reviewed and stable  Complications: No apparent anesthesia complications
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
## 2011-09-08 NOTE — Interval H&P Note (Signed)
History and Physical Interval Note:  09/08/2011 7:13 AM  Phillip Moore  has presented today for surgery, with the diagnosis of Retained Hardware Left Hip/Avascular Necrosis  The various methods of treatment have been discussed with the patient and family. After consideration of risks, benefits and other options for treatment, the patient has consented to  Procedure(s) (LRB): HARDWARE REMOVAL (Left)  CONVERSION OF PREVIOUS HP SURGERY TO LEFT TOTAL HIP REPLACEMENT (Left) as a surgical intervention .  The patients' history has been reviewed, patient examined, no change in status, stable for surgery.  I have reviewed the patients' chart and labs.  Questions were answered to the patient's satisfaction.     [name]

## 2011-09-09 LAB — BASIC METABOLIC PANEL
CO2: 28 mEq/L (ref 19–32)
Chloride: 104 mEq/L (ref 96–112)
Creatinine, Ser: 0.64 mg/dL (ref 0.50–1.35)
Glucose, Bld: 138 mg/dL — ABNORMAL HIGH (ref 70–99)
Sodium: 137 mEq/L (ref 135–145)

## 2011-09-09 LAB — CBC
Hemoglobin: 10.1 g/dL — ABNORMAL LOW (ref 13.0–17.0)
MCV: 90.1 fL (ref 78.0–100.0)
Platelets: 233 10*3/uL (ref 150–400)
RBC: 3.44 MIL/uL — ABNORMAL LOW (ref 4.22–5.81)
WBC: 15.4 10*3/uL — ABNORMAL HIGH (ref 4.0–10.5)

## 2011-09-09 NOTE — Progress Notes (Signed)
Physical Therapy Treatment Patient Details Name: Phillip Moore MRN: [id number] DOB: [birth year] Today's Date: 09/09/2011 [id number] PT Assessment/Plan  PT - Assessment/Plan Comments on Treatment Session: pt's gait appeared improved in that he could somewhat advance LLE, pt expresses concern that legs do not appear to be same length. deferred pt. concern to MD. PT Plan: Discharge plan remains appropriate PT Frequency: 7X/week Recommendations for Other Services: OT consult Follow Up Recommendations: Home health PT Equipment Recommended: Rolling walker with 5" wheels (will neede to get more definite info re:RW) PT Goals  Acute Rehab PT Goals PT Goal Formulation: With patient Time For Goal Achievement: 7 days Pt will go Supine/Side to Sit: Independently PT Goal: Supine/Side to Sit - Progress: Goal set today Pt will go Sit to Supine/Side: Independently PT Goal: Sit to Supine/Side - Progress: Goal set today Pt will go Sit to Stand: with supervision PT Goal: Sit to Stand - Progress: Progressing toward goal Pt will go Stand to Sit: with supervision PT Goal: Stand to Sit - Progress: Progressing toward goal Pt will Ambulate: 51 - 150 feet;with rolling walker PT Goal: Ambulate - Progress: Progressing toward goal Pt will Go Up / Down Stairs: 3-5 stairs;with least restrictive assistive device;with min assist PT Goal: Up/Down Stairs - Progress: Goal set today Pt will Perform Home Exercise Program: with supervision, verbal cues required/provided PT Goal: Perform Home Exercise Program - Progress: Progressing toward goal Additional Goals Additional Goal #1: demo/state posterior hip precautions PT Goal: Additional Goal #1 - Progress: Progressing toward goal  PT Treatment Precautions/Restrictions  Precautions Precautions: Posterior Hip Precaution Comments: provided written instruction for post. hip precautions Required Braces or Orthoses: Other Brace/Splint Other Brace/Splint: ABD pillow while  in bed Restrictions Weight Bearing Restrictions: No Mobility (including Balance) Bed Mobility Bed Mobility: Yes  Transfers Transfers: Yes Sit to Stand: 4: Min assist;With upper extremity assist;From chair/3-in-1 Sit to Stand Details (indicate cue type and reason): vc for post. precautions, pt tends to lean forward while in sitting, flexing greater than 90. Stand to Sit: 4: Min assist;To chair/3-in-1;With upper extremity assist Stand to Sit Details: pt did reach back to recliner w/ vc Ambulation/Gait Ambulation/Gait: Yes Ambulation/Gait Assistance: 3: Mod assist Ambulation/Gait Assistance Details (indicate cue type and reason): pt noted to have less IR of thigh this walk. pt appeared to advance LLE w/ more control. remains WB on L forefoot due to decreased ROM of hip and knee.  trunk also tending to be flexed Ambulation Distance (Feet): 40 Feet Assistive device: Rolling walker Gait Pattern: Step-to pattern;Decreased dorsiflexion - left;Decreased weight shift to left;Left flexed knee in stance;Shuffle Gait velocity: slow    Exercise  Total Joint Exercises Ankle Circles/Pumps: AROM;Left;10 reps;Seated Quad Sets: AROM;Right;Left;10 reps;Supine Knee Flexion: AAROM;Left;10 reps;Seated End of Session PT - End of Session Activity Tolerance: Patient tolerated treatment well Patient left: in chair;with call bell in reach (reinforced to pt. to call for assistance.) Nurse Communication: Mobility status for transfers (pt is a bit impulsive) General Behavior During Session: [facility] for tasks performed Cognition: [facility] for tasks performed  Rada Hay 09/09/2011, 4:22 PM

## 2011-09-09 NOTE — Progress Notes (Signed)
Utilization review completed.  
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
## 2011-09-09 NOTE — Evaluation (Signed)
Physical Therapy Evaluation Patient Details Name: Phillip Moore MRN: [id number] DOB: 19-Aug-1970 Today's Date: 09/09/2011  Problem List:  Patient Active Problem List  Diagnoses  . ASTHMA  . Nonspecific (abnormal) findings on radiological and other examination of body structure  . FRACTURE, FEMUR, LEFT  . FRACTURE, TIBIA  . CT, CHEST, ABNORMAL  . S/P left total hip revision    Past Medical History:  Past Medical History  Diagnosis Date  . Asthma     last flare up 2 months ago   Past Surgical History:  Past Surgical History  Procedure Date  . Left arm surgery for fracture nov 2010  . Left knee im rod implant surgery for fracture nov 2010    PT Assessment/Plan/Recommendation PT Assessment Clinical Impression Statement: pt is s/p removal of hardware (IM nail) of L hip and replaced w/ Posterior THA due to avascular necrosis, Pt had shorter LLE PTA. per pt. of at least 2 inches. pt has noted atrophy of LLE and decreased ROM of L hip/knee/ankle Dorsiflxion. Pt will benefit from PT to improve safety, ROM,Strength and functional mobility to DC to home w/ 24/7 PT Recommendation/Assessment: Patient will need skilled PT in the acute care venue PT Problem List: Decreased strength;Decreased range of motion;Decreased activity tolerance;Decreased mobility;Decreased safety awareness;Decreased knowledge of precautions;Decreased knowledge of use of DME;Pain PT Therapy Diagnosis : Difficulty walking;Acute pain PT Plan PT Frequency: 7X/week PT Treatment/Interventions: DME instruction;Gait training;Stair training;Functional mobility training;Therapeutic activities;Therapeutic exercise;Patient/family education PT Recommendation Recommendations for Other Services: OT consult Follow Up Recommendations: Home health PT Equipment Recommended: Rolling walker with 5" wheels (will neede to get more definite info re:RW) PT Goals  Acute Rehab PT Goals PT Goal Formulation: With patient Time For Goal  Achievement: 7 days Pt will go Supine/Side to Sit: Independently PT Goal: Supine/Side to Sit - Progress: Goal set today Pt will go Sit to Supine/Side: Independently PT Goal: Sit to Supine/Side - Progress: Goal set today Pt will go Sit to Stand: with supervision PT Goal: Sit to Stand - Progress: Goal set today Pt will go Stand to Sit: with supervision PT Goal: Stand to Sit - Progress: Goal set today Pt will Ambulate: 51 - 150 feet;with supervision;with rolling walker PT Goal: Ambulate - Progress: Goal set today Pt will Go Up / Down Stairs: 3-5 stairs;with least restrictive assistive device;with min assist PT Goal: Up/Down Stairs - Progress: Goal set today Pt will Perform Home Exercise Program: with supervision, verbal cues required/provided PT Goal: Perform Home Exercise Program - Progress: Goal set today Additional Goals Additional Goal #1: demo/state posterior hip precautions PT Goal: Additional Goal #1 - Progress: Goal set today  PT Evaluation Precautions/Restrictions  Precautions Precautions: Posterior Hip Precaution Comments: pt had ABD pillow in place but RLE was resting on top of pillow, attempted to review posterior hip precautions but pt did not appear to grasp instructiobns at the time, pt was impulsive Required Braces or Orthoses: Other Brace/Splint Other Brace/Splint: ABD pillow while in bed Restrictions Weight Bearing Restrictions: No Prior Functioning  Home Living Lives With: Alone Available Help at Discharge: Family;Friend(s);Available 24 hours/day (per pt.) Type of Home: House Home Access: Stairs to enter (pt states he has a portable ramp/ does not want to use it) Entrance Stairs-Number of Steps: 5 Entrance Stairs-Rails: Can reach both (pt  was not clear on rails) Home Layout: One level Bathroom Toilet: Standard Home Adaptive Equipment: Walker - four wheeled;Straight cane Additional Comments: pt states his RW has brakes but 2 wheels Prior Function Level of  Independence: Independent with assistive device(s) Able to Take Stairs?: Yes Cognition Cognition Arousal/Alertness: Awake/alert Overall Cognitive Status: Appears within functional limits for tasks assessed Orientation Level: Oriented X4 Cognition - Other Comments: pt talks rapidly and tangentially Sensation/Coordination Sensation Light Touch: Appears Intact (L leg) Coordination Gross Motor Movements are Fluid and Coordinated: Yes (all extremeties except LLE) Extremity Assessment RLE Assessment RLE Assessment: Within Functional Limits LLE Assessment LLE Assessment: Exceptions to WFL LLE AROM (degrees) LLE Overall AROM Comments: pt w/ noted decreased hip extension/ unable to lie supine w/ hip near neutral, lacks full extension of L knee .  dorsiflexion to neutral with stretch LLE Strength LLE Overall Strength Comments: pt  has 3/5 knee extension, does not actively flex hip but uses hand or RLE to move LLE in bed. hip flexors  about 2+/5. dorsiflxion 3+/5 Mobility (including Balance) Bed Mobility Bed Mobility: Yes Supine to Sit: 3: Mod assist;HOB elevated (Comment degrees);With rails Supine to Sit Details (indicate cue type and reason): VC/ to attempt to have pt not use RLE to move LLE. pt reports this is how he moved LLE PTA.  HOB @ 50, Transfers Transfers: Yes Sit to Stand: 1: +2 Total assist;From elevated surface;With upper extremity assist Sit to Stand Details (indicate cue type and reason): vc/TC to attempt to keep LLE from adducting. as pt stood up at [facility].. pt= 70 %. L leg is flexed at hip and knee Stand to Sit: 4: Min assist;To chair/3-in-1;With upper extremity assist Stand to Sit Details: VC to reach to armrests, posterior hip precautions Ambulation/Gait Ambulation/Gait: Yes Ambulation/Gait Assistance: 1: +2 Total assist Ambulation/Gait Assistance Details (indicate cue type and reason): pt=70%. vc/TC to try to keep L leg/knee from IR and adducting,, pt places weight only on  forefoot. Pt tends to drag LLE and has difficulty advancing LLE first. Ambulation Distance (Feet): 15 Feet Assistive device: Rolling walker Gait Pattern: Step-to pattern;Decreased weight shift to left;Left flexed knee in stance;Decreased step length - left;Decreased stance time - left;Decreased dorsiflexion - left (pt tends to also lean forward onto RW,) Gait velocity: slow    Exercise    End of Session PT - End of Session Activity Tolerance: Patient tolerated treatment well;Patient limited by pain, limited by significant gait abnormality Patient left: in chair;with call bell in reach Nurse Communication: Mobility status for transfers General Behavior During Session: [facility] for tasks performed (pt is mildly impulsive) Cognition: [facility] for tasks performed  Rada Hay 09/09/2011, 4:07 PM  [id number] [phone]

## 2011-09-09 NOTE — Progress Notes (Signed)
Subjective: 1 Day Post-Op Procedure(s) (LRB): HARDWARE REMOVAL (Left) CONVERSION TO TOTAL HIP (Left)   Patient reports pain as mild. Resting well with abduction pillow in place. Pain well controlled. No events throughout the night.   Objective:   VITALS:   Filed Vitals:   09/09/11 0544  BP: 103/70  Pulse: 91  Temp: 98 F (36.7 C)  Resp: 18    Neurovascular intact Dorsiflexion/Plantar flexion intact Incision: dressing C/D/I No cellulitis present Compartment soft  LABS  Basename 09/09/11 0430  HGB 10.1*  HCT 31.0*  WBC 15.4*  PLT 233     Basename 09/09/11 0430  NA 137  K 4.0  BUN 11  CREATININE 0.64  GLUCOSE 138*     Assessment/Plan: 1 Day Post-Op Procedure(s) (LRB): HARDWARE REMOVAL (Left) CONVERSION TO TOTAL HIP (Left)   HV drain d/c'ed Foley cath d/c'ed Advance diet Up with therapy D/C IV fluids Plan for discharge tomorrow to home if does well with therapy   Phillip Moore. Phillip Moore   PAC  09/09/2011, 8:05 AM

## 2011-09-10 LAB — URINE CULTURE
Colony Count: NO GROWTH
Culture  Setup Time: 201304160724

## 2011-09-10 LAB — CBC
HCT: 31.7 % — ABNORMAL LOW (ref 39.0–52.0)
MCH: 29.6 pg (ref 26.0–34.0)
MCV: 90.3 fL (ref 78.0–100.0)
RBC: 3.51 MIL/uL — ABNORMAL LOW (ref 4.22–5.81)
WBC: 17.7 10*3/uL — ABNORMAL HIGH (ref 4.0–10.5)

## 2011-09-10 LAB — BASIC METABOLIC PANEL
BUN: 8 mg/dL (ref 6–23)
CO2: 28 mEq/L (ref 19–32)
Chloride: 103 mEq/L (ref 96–112)
Creatinine, Ser: 0.56 mg/dL (ref 0.50–1.35)

## 2011-09-10 MED ORDER — METHOCARBAMOL 500 MG PO TABS: 500.0000 mg | ORAL_TABLET | Freq: Four times a day (QID) | ORAL | Status: AC | PRN

## 2011-09-10 MED ORDER — FERROUS SULFATE 325 (65 FE) MG PO TABS: 325.0000 mg | ORAL_TABLET | Freq: Three times a day (TID) | ORAL | Status: AC

## 2011-09-10 MED ORDER — HYDROCODONE-ACETAMINOPHEN 7.5-325 MG PO TABS: 1.0000 | ORAL_TABLET | ORAL | Status: AC | PRN

## 2011-09-10 MED ORDER — DIPHENHYDRAMINE HCL 25 MG PO CAPS: 25.0000 mg | ORAL_CAPSULE | Freq: Four times a day (QID) | ORAL | Status: AC | PRN

## 2011-09-10 MED ORDER — POLYETHYLENE GLYCOL 3350 17 G PO PACK: 17.0000 g | PACK | Freq: Two times a day (BID) | ORAL | Status: AC

## 2011-09-10 MED ORDER — DSS 100 MG PO CAPS: 100.0000 mg | ORAL_CAPSULE | Freq: Two times a day (BID) | ORAL | Status: AC

## 2011-09-10 MED ORDER — ASPIRIN EC 325 MG PO TBEC: 325.0000 mg | DELAYED_RELEASE_TABLET | Freq: Two times a day (BID) | ORAL | Status: AC

## 2011-09-10 NOTE — Progress Notes (Signed)
OT Note:  Offered pt a reacher for home:  He did not want this stating that it probably wouldn't be in the right place for him to access it.  No family available for education at this time.  [place], Lena [phone] 09/10/2011
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
## 2011-09-10 NOTE — Progress Notes (Signed)
Physical Therapy Treatment Patient Details Name: Phillip Moore MRN: [id number] DOB: Nov 08, 1970 Today's Date: 09/10/2011  10:30-11:10 1gt, 1ta, 1te  PT Assessment/Plan  Comments on Treatment Session: Pt required education on THP-pt. was supine with legs crossed when we entered room; pt said it would be hard to stop habit because he uses RLE to move LLE.  Reviewed THP with pt.  Pt c/o pain in L knee, wondering why its bruised.  Explained to pt. it wasn't bruising but betadine, but pt insisted it was bruised. During ambulation, pt required manual assistance to advance LLE with RW. Pt.  stated LLE is too short to put L heel down on the floor.  Pt required +2 total assist pt =50%  for LLE to go up/down stairs.  Pt refused to use RW on stairs, and didn't want to use crutches, and said he was just going to crawl up stairs. Pt was educated to use one crutch with one rail on stairs.  Pt wouldn't give direct answer to questions- for example, when asked how many stairs he had at home, he stated "one stairs are big and the other one is smaller:"Pt. refused to do exercises stating he needed drugs and would just forget them.  Patient left sitting in chair with ice pack on L hip. Equipment Recommended: Rolling walker with 5" wheels;3 in 1 bedside comode;Other (comment) (Pt does not feel he needs a 3:1.  He has a standard commode/) PT Goals  Acute Rehab PT Goals PT Goal Formulation: With patient Pt will go Supine/Side to Sit: Independently PT Goal: Supine/Side to Sit - Progress: Progressing toward goal Pt will go Sit to Stand: with supervision PT Goal: Sit to Stand - Progress: Progressing toward goal Pt will go Stand to Sit: with supervision PT Goal: Stand to Sit - Progress: Progressing toward goal Pt will Ambulate: 51 - 150 feet;with rolling walker PT Goal: Ambulate - Progress: Progressing toward goal Pt will Go Up / Down Stairs: 3-5 stairs;with least restrictive assistive device;with min assist PT Goal:  Up/Down Stairs - Progress: Progressing toward goal Pt will Perform Home Exercise Program: with supervision, verbal cues required/provided PT Goal: Perform Home Exercise Program - Progress: Progressing toward goal Additional Goals PT Goal: Additional Goal #1 - Progress: Progressing toward goal  PT Treatment Precautions/Restrictions  Precautions Precautions: Posterior Hip Precaution Comments: provided written instruction for post. hip precautions Required Braces or Orthoses: Other Brace/Splint Other Brace/Splint: ABD pillow while in bed Restrictions Weight Bearing Restrictions: No Mobility (including Balance) Bed Mobility Bed Mobility: Yes Supine to Sit: 4: Min assist Supine to Sit Details (indicate cue type and reason): vc needed for THP and assisted with LLE Transfers Transfers: Yes Sit to Stand: 4: Min assist;From bed;From chair/3-in-1 (min assist with LLE, VC needed for THP) Sit to Stand Details (indicate cue type and reason): VC needed for THP Stand to Sit: 4: Min assist;To chair/3-in-1 Stand to Sit Details: VC needed for THP with LLE Ambulation/Gait Ambulation/Gait: Yes Ambulation/Gait Assistance: 2: Max assist Ambulation/Gait Assistance Details (indicate cue type and reason): max assist needed to advance LLE, would not put heel down on LLE stating "I can't, my leg is shorter than the other one" Ambulation Distance (Feet): 30 Feet Assistive device: Rolling walker Gait Pattern: Left flexed knee in stance;Trunk flexed;Decreased weight shift to left;Decreased dorsiflexion - left;Step-to pattern (LLE had to be manually advanced by PT) Gait velocity: slow Stairs: Yes Stairs Assistance: 1: +2 Total assist Stairs Assistance Details (indicate cue type and reason): +2 total assist  pt=50%; one rail/one crutch, PT had to manually move LLE up/down, 100% VC needed Stair Management Technique: One rail Right;With crutches Number of Stairs: 4  Wheelchair Mobility Wheelchair Mobility: No      Exercise  Total Joint Exercises Ankle Circles/Pumps: AROM;Both;10 reps;Seated Quad Sets: AROM;Left;Other reps (comment);Seated (asked pt to do 10, pt.tried one and said he needed drugs) Short Arc Quad: AAROM;Left;Other reps (comment);Seated (pt attempted one and refused to do more without drugs) End of Session PT - End of Session Equipment Utilized During Treatment: Gait belt Activity Tolerance: Patient tolerated treatment well (pt very impulsive, agitated, distracted) Patient left: in chair;with call bell in reach General Behavior During Session: Other (comment) (argumentative, distracted) Cognition:  (difficulty following THPS)  Goodwill, Denice, SPTA 09/10/2011, 1:36 PM  Felecia Shelling  PTA [facility]  Acute  Rehab Pager     [phone]

## 2011-09-10 NOTE — Progress Notes (Signed)
Subjective: 2 Days Post-Op Procedure(s) (LRB): HARDWARE REMOVAL (Left) CONVERSION TO TOTAL HIP (Left)   Patient reports pain as mild. Pain from left hip to knee, but feels it is muscle strain. It is feeling better. Pain well controlled with medications. No events throughout the night. Ready to be discharged home.   Objective:   VITALS:   Filed Vitals:   09/10/11 0640  BP: 119/75  Pulse: 83  Temp: 97.6 F (36.4 C)  Resp: 16    Neurovascular intact Dorsiflexion/Plantar flexion intact Incision: dressing C/D/I No cellulitis present Compartment soft Flexion contracture of the hip and knee, same as prior to surgery, will take time to work on.  LABS  Basename 09/10/11 0358 09/09/11 0430  HGB 10.4* 10.1*  HCT 31.7* 31.0*  WBC 17.7* 15.4*  PLT 270 233     Basename 09/10/11 0358 09/09/11 0430  NA 139 137  K 3.8 4.0  BUN 8 11  CREATININE 0.56 0.64  GLUCOSE 116* 138*     Assessment/Plan: 2 Days Post-Op Procedure(s) (LRB): HARDWARE REMOVAL (Left) CONVERSION TO TOTAL HIP (Left)   Up with therapy D/C IV fluids Discharge home with home health today Follow up in 2 weeks at [facility].  Follow-up Information    Follow up with OLIN,Ophia Shamoon D in 2 weeks.   Contact information:   [facility] [address] [place] [address] [phone]          Anastasio Auerbach. Dayonna Selbe   PAC  09/10/2011, 9:15 AM

## 2011-09-10 NOTE — Evaluation (Addendum)
Occupational Therapy Evaluation Patient Details Name: Phillip Moore MRN: [id number] DOB: [birth year] Today's Date: 09/10/2011  Problem List:  Patient Active Problem List  Diagnoses  . ASTHMA  . Nonspecific (abnormal) findings on radiological and other examination of body structure  . FRACTURE, FEMUR, LEFT  . FRACTURE, TIBIA  . CT, CHEST, ABNORMAL  . S/P left total hip revision    Past Medical History:  Past Medical History  Diagnosis Date  . Asthma     last flare up 2 months ago   Past Surgical History:  Past Surgical History  Procedure Date  . Left arm surgery for fracture nov 2010  . 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  He has posterior THPs and is WBAT.  Pt needs reinforcement of hip precautions:  he did not safely demonstrate these when sitting EOB:  no family available to educate.  Pt is scheduled to discharge today; if possible we will return to educate.   Recommend HHOT.  OT Recommendation/Assessment: All further OT needs can be met in the next venue of care OT Recommendation Follow Up Recommendations: Home health OT Equipment Recommended: Rolling walker with 5" wheels;3 in 1 bedside comode;Other (comment) (Pt does not feel he needs a 3:1.  He has a standard commode/wooden grab bar.  Pt was in too much pain to walk to bathroom (premedicated) and try via leaning backwards, but he does not follow precautions currently) OT Goals Acute Rehab OT Goals OT Goal Formulation: With patient Time For Goal Achievement: 7 days ADL Goals Pt Will Transfer to Toilet: with min assist;Ambulation;3-in-1;with cueing (comment type and amount);Maintaining hip precautions (mod cues for THPs) ADL Goal: Toilet Transfer - Progress: Goal set today Miscellaneous OT Goals Miscellaneous OT Goal #1: Pt will  verbalize 3/3 THPS OT Goal: Miscellaneous Goal #1 - Progress: Goal set today Miscellaneous OT Goal #2: Family/caregivers will verbalize understanding of THPs during ADLs/transfers OT Goal: Miscellaneous Goal #2 - Progress: Goal set today  OT Evaluation Precautions/Restrictions  Precautions Precautions: Posterior Hip Precaution Comments: provided written instruction for post. hip precautions Required Braces or Orthoses: Other Brace/Splint Other Brace/Splint: ABD pillow while in bed Restrictions Weight Bearing Restrictions: No Prior Functioning Home Living Lives With: Alone Available Help at Discharge: Family;Friend(s);Available 24 hours/day Type of Home: House Home Access: Stairs to enter [employer] of Steps: 5 Entrance Stairs-Rails: Can reach both Home Layout: One level Bathroom Shower/Tub: Tub/shower unit;Other (comment) (with seat) Bathroom Toilet: Standard (with grab bar (wooden)) Home Adaptive Equipment: Walker - four wheeled;Straight cane;Shower chair without back Prior Function Level of Independence: Independent with assistive device(s)  ADL ADL Eating/Feeding: Simulated;Independent Where Assessed - Eating/Feeding: Edge of bed Grooming: Simulated;Set up;Supervision/safety Where Assessed - Grooming: Sitting, bed;Unsupported Upper Body Bathing: Simulated;Supervision/safety Where Assessed - Upper Body Bathing: Sit to stand from bed Lower Body Bathing: Simulated;Maximal assistance Where Assessed - Lower Body Bathing: Sit to stand from bed (clinical judgment for sit to stand as pt only got to EOB) Upper Body Dressing: Simulated;Supervision/safety Where Assessed - Upper Body Dressing: Sitting, bed;Unsupported Lower Body Dressing: Simulated;+1 Total assistance Where Assessed - Lower Body Dressing: Sit to stand from bed Toilet Transfer: Not assessed;Other (comment) (pt stated he was in too much pain:  educated on 3:1) Toileting - Clothing Manipulation: Not  assessed Toileting - Hygiene: Not assessed Tub/Shower Transfer: Not assessed  Ambulation Related to ADLs: Pt not agreeable to getting OOB or standing:  only sat at EOB.   ADL Comments: Reviewed THPs and ADLs.  Pt doesn't follow 90 degrees although he can state this one only.  Mod cues sitting EOB.  Showed reacher and educated on uses Vision/Perception    Cognition Cognition Arousal/Alertness: Awake/alert Overall Cognitive Status: Impaired (needs reinforcement with THPs:  pt doesn't follow) Orientation Level: Oriented X4 Sensation/Coordination   Extremity Assessment RUE Assessment RUE Assessment: Within Functional Limits LUE Assessment LUE Assessment: Within Functional Limits Mobility  Bed Mobility Bed Mobility: Yes Supine to Sit: 4: Min assist;Other (comment) (min cues for thps) Exercises   End of Session OT - End of Session Activity Tolerance: Patient limited by pain Patient left: in bed;with call bell in reach General Behavior During Session: Other (comment) (initially cooperative but became argumentative:) Cognition:  (difficulty following THPS) Phillip Moore, Phillip Moore [phone] 09/10/2011  Phillip Moore 09/10/2011, 11:18 AM

## 2011-09-18 ENCOUNTER — Encounter (HOSPITAL_COMMUNITY): Payer: Self-pay | Admitting: Orthopedic Surgery

## 2017-09-24 ENCOUNTER — Telehealth: Payer: Self-pay | Admitting: Family Medicine

## 2017-09-24 NOTE — Telephone Encounter (Signed)
See CRM and advise. No new patient appt has been made yet.    Kathi Simpers,  LPN

## 2017-09-24 NOTE — Telephone Encounter (Signed)
Copied from CRM [id number]. Topic: Quick Communication - See Telephone Encounter >> Sep 24, 2017 11:16 AM Eston Mould B wrote: CRM for notification. See Telephone encounter for: 09/24/17. PT is asking if he can get symbicort 4.5 mcg  refilled from Dr Mardelle Matte, he is a former pt at [facility] and says he had refills left at [facility] but since they changed over to [facility]  he could not get those last refills   [facility] [phone] - , [place] - [address] [phone] (Phone) [phone] (Fax)

## 2017-09-25 NOTE — Telephone Encounter (Signed)
Spoke with Pharmacy, advised pharmacy we could not fill Symbicort, due to him not being a current patient of ours at [facility]. I advised Pharmacy staff he would need to make an appt with [place].   Kathi Simpers,  LPN

## 2017-09-25 NOTE — Telephone Encounter (Signed)
Last seen at [facility] by me in 2017. Not my patient at [facility]. Cannot refill.
